# Patient Record
Sex: Male | Born: 1995 | Race: White | Hispanic: No | Marital: Single | Smoking: Never smoker
Health system: Southern US, Community
[De-identification: ages and names within clinical notes are randomized; demographics above are authoritative.]

## PROBLEM LIST (undated history)

## (undated) DIAGNOSIS — S069X9A Unspecified intracranial injury with loss of consciousness of unspecified duration, initial encounter: Secondary | ICD-10-CM

## (undated) DIAGNOSIS — S069XAA Unspecified intracranial injury with loss of consciousness status unknown, initial encounter: Secondary | ICD-10-CM

## (undated) DIAGNOSIS — F909 Attention-deficit hyperactivity disorder, unspecified type: Secondary | ICD-10-CM

## (undated) HISTORY — PX: ABDOMINAL SURGERY: SHX537

---
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
## 2017-01-19 IMAGING — DX DG ANKLE COMPLETE 3+V*R*
3 series · 3 of 3 positions shown · non-contrast
Comparison: None.

CLINICAL DATA: Ankle injury today

EXAM:
RIGHT ANKLE - COMPLETE 3+ VIEW

[ankle ap]
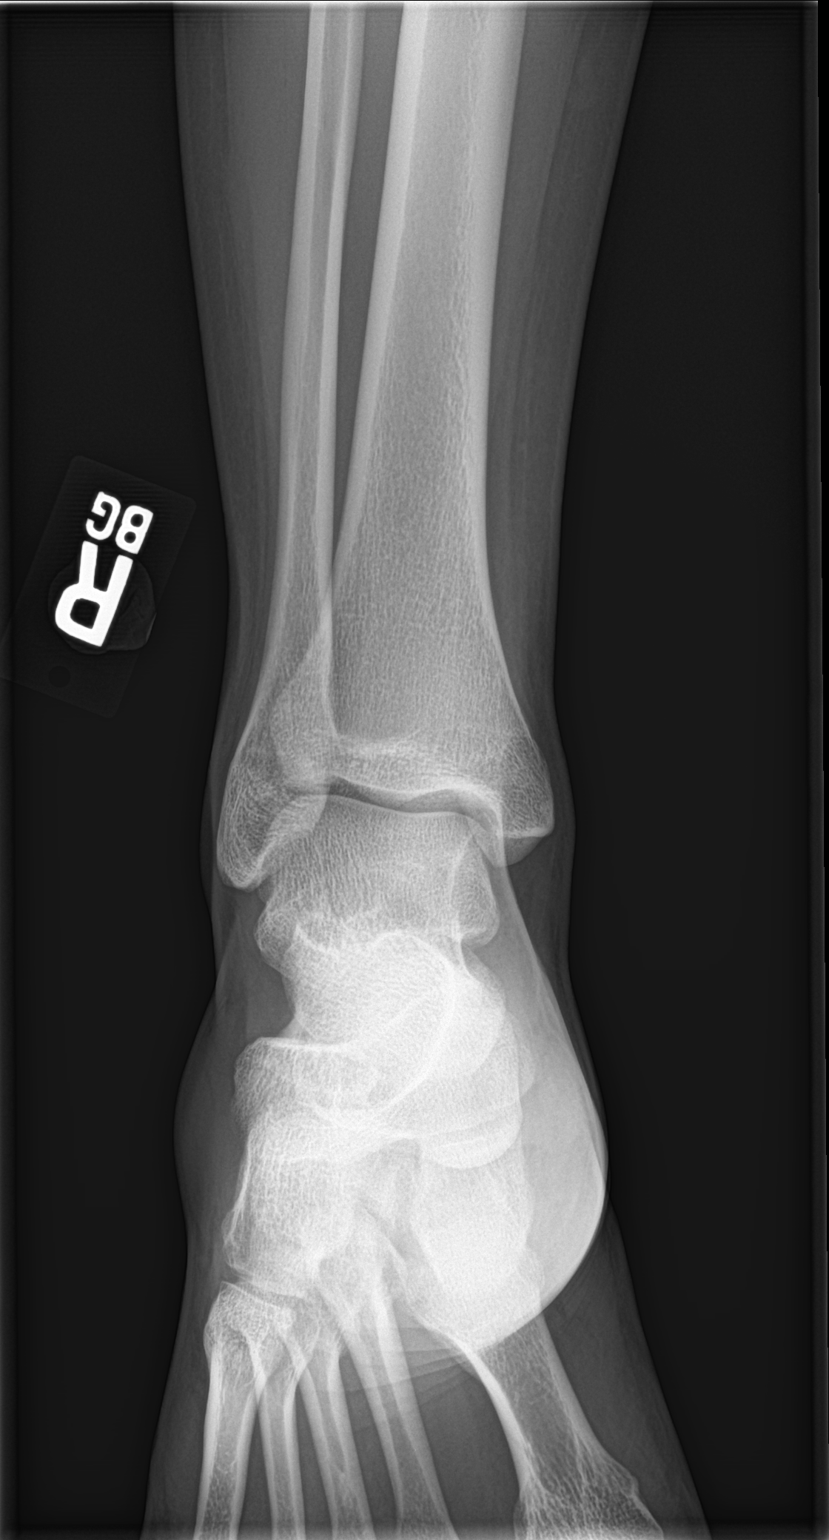

[ankle obl]
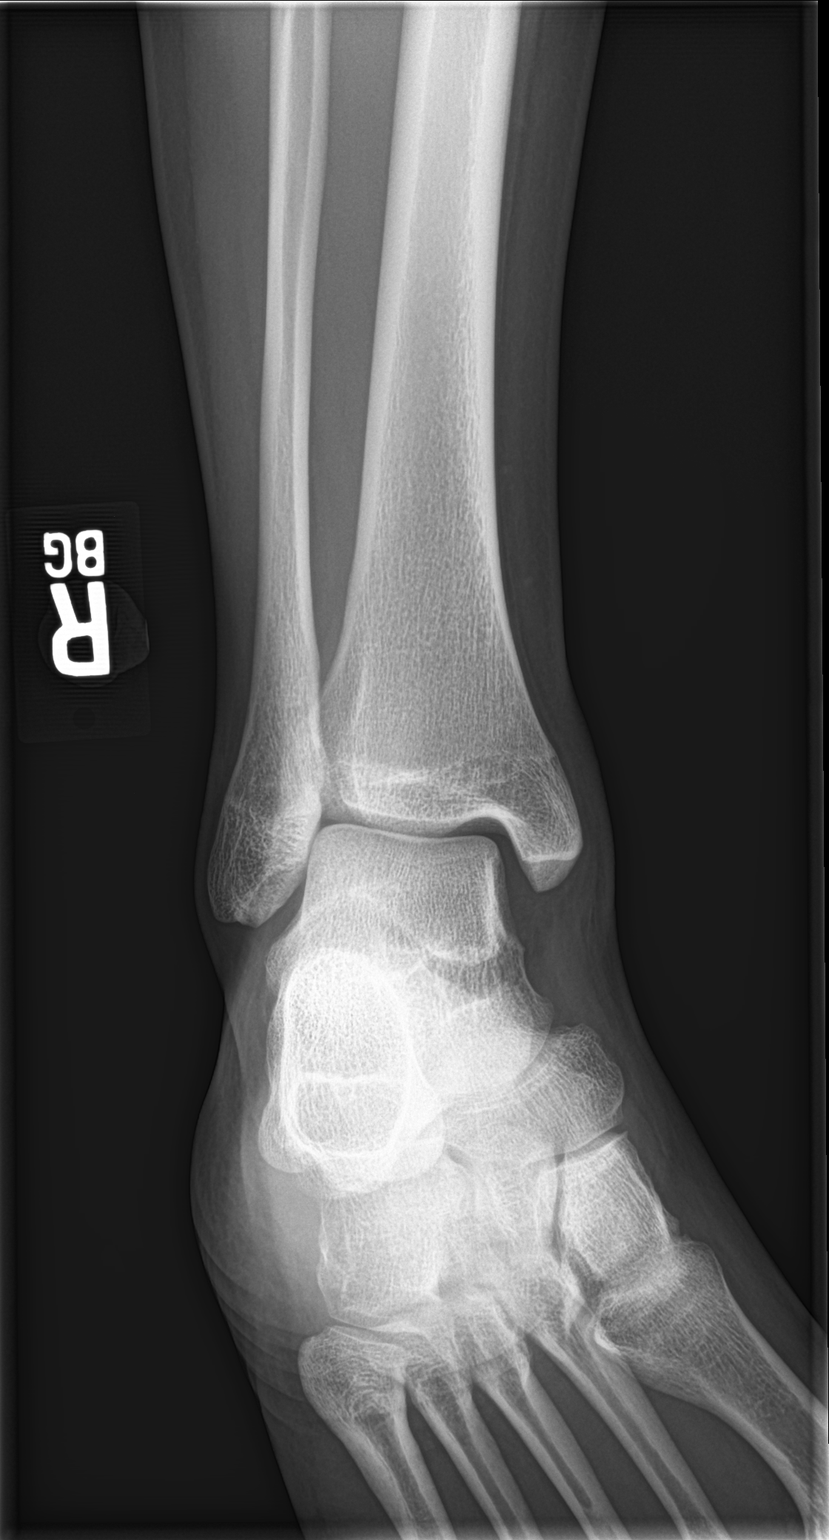

[ankle lat]
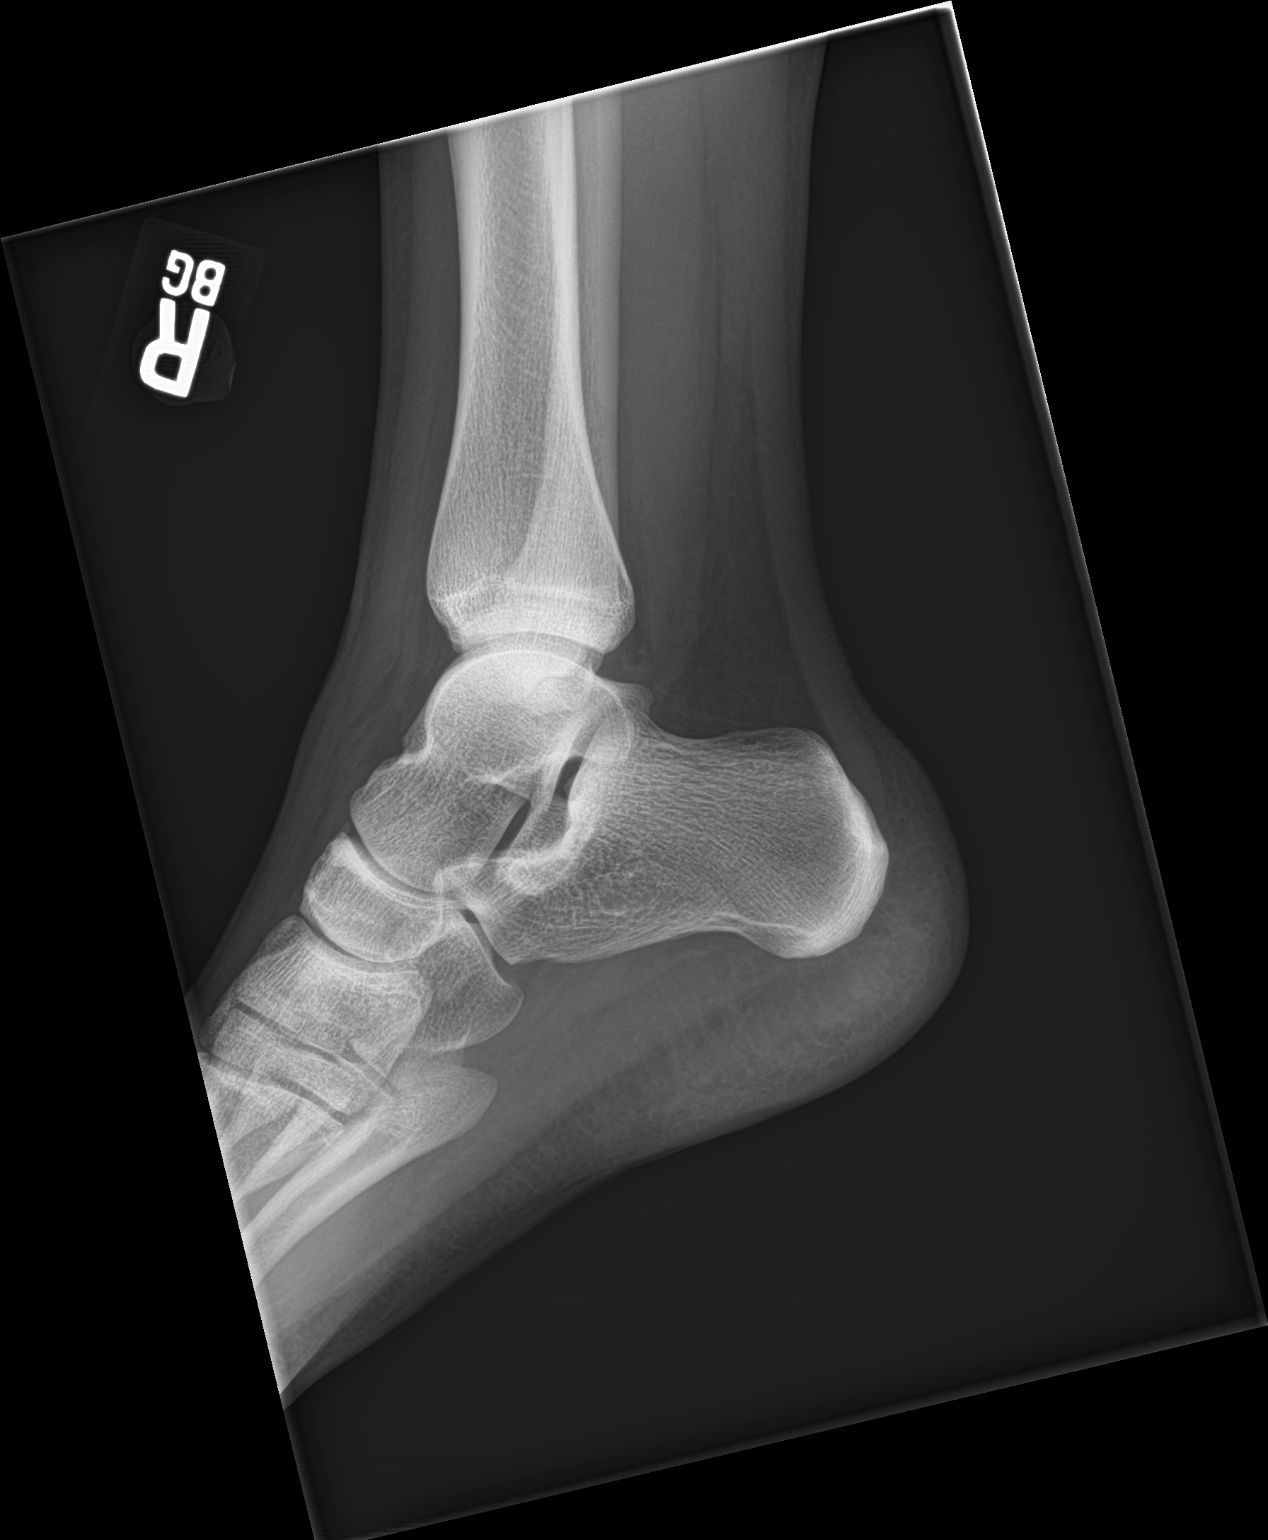

[3 of 3 positions shown; findings below may reference images not displayed]

FINDINGS: No acute fracture.  No dislocation.
IMPRESSION: No acute bony pathology.

## 2019-07-03 ENCOUNTER — Other Ambulatory Visit: Payer: Self-pay

## 2019-07-03 ENCOUNTER — Encounter (HOSPITAL_COMMUNITY): Payer: Self-pay | Admitting: Emergency Medicine

## 2019-07-03 ENCOUNTER — Emergency Department (HOSPITAL_COMMUNITY)
Admission: EM | Admit: 2019-07-03 | Discharge: 2019-07-04 | Disposition: A | Discharge status: Home / Self Care | Payer: BC Managed Care – PPO | Attending: Emergency Medicine | Admitting: Emergency Medicine

## 2019-07-03 DIAGNOSIS — Y999 Unspecified external cause status: Secondary | ICD-10-CM | POA: Diagnosis not present

## 2019-07-03 DIAGNOSIS — S91311A Laceration without foreign body, right foot, initial encounter: Secondary | ICD-10-CM | POA: Insufficient documentation

## 2019-07-03 DIAGNOSIS — Y92199 Unspecified place in other specified residential institution as the place of occurrence of the external cause: Secondary | ICD-10-CM | POA: Insufficient documentation

## 2019-07-03 DIAGNOSIS — W25XXXA Contact with sharp glass, initial encounter: Secondary | ICD-10-CM | POA: Insufficient documentation

## 2019-07-03 DIAGNOSIS — S99921A Unspecified injury of right foot, initial encounter: Secondary | ICD-10-CM | POA: Diagnosis present

## 2019-07-03 DIAGNOSIS — Y939 Activity, unspecified: Secondary | ICD-10-CM | POA: Diagnosis not present

## 2019-07-03 MED ORDER — LIDOCAINE-EPINEPHRINE (PF) 2 %-1:200000 IJ SOLN
10.0000 mL | Freq: Once | INTRAMUSCULAR | Status: AC
  Administered 2019-07-04: 10 mL
  Filled 2019-07-03: qty 10

## 2019-07-03 MED ORDER — PENTAFLUOROPROP-TETRAFLUOROETH EX AERO
INHALATION_SPRAY | CUTANEOUS | Status: DC | PRN
  Filled 2019-07-03: qty 116

## 2019-07-03 NOTE — ED Triage Notes (Signed)
Pt here after dropping a glass panel on his foot and now has laceration to RIGHT foot. Laceration dressed in triage with pressure bandage.

## 2019-07-04 ENCOUNTER — Emergency Department (HOSPITAL_COMMUNITY): Payer: BC Managed Care – PPO

## 2019-07-04 NOTE — ED Provider Notes (Signed)
[facility] EMERGENCY DEPARTMENT Provider Note   CSN: [id number] Arrival date & time: 07/03/19  2234     History Chief Complaint  Patient presents with  . Laceration    DAL BLEW is a 24 y.o. male.  Patient sustained laceration to his right lateral foot.  States he was changing a window pane of glass and dropped it when it shattered onto his bare feet.  Sustained laceration to the right lateral foot with extensive bleeding.  He says there is some numbness and tingling around the site of the wound.  He is able to wiggle his toes and move his ankle up and down.  No other injury.  Tetanus is up-to-date.  The history is provided by the patient.  Laceration Associated symptoms: no fever        Past Medical History:  Diagnosis Date  . ADHD (attention deficit hyperactivity disorder)   . TBI (traumatic brain injury) ([place])     There are no problems to display for this patient.   Past Surgical History:  Procedure Laterality Date  . ABDOMINAL SURGERY         History reviewed. No pertinent family history.  Social History   Tobacco Use  . Smoking status: Never Smoker  Substance Use Topics  . Alcohol use: No  . Drug use: No    Home Medications Prior to Admission medications   Medication Sig Start Date End Date Taking? Authorizing Provider  amphetamine-dextroamphetamine (ADDERALL XR) 30 MG 24 hr capsule Take 30 mg by mouth every morning.  06/04/14   [provider]  ibuprofen (ADVIL,MOTRIN) 200 MG tablet Take 800 mg by mouth every 6 (six) hours as needed for mild pain or moderate pain.    [provider]    Allergies    Chloral hydrate  Review of Systems   Review of Systems  Constitutional: Negative for activity change, appetite change and fever.  HENT: Negative for congestion and rhinorrhea.   Eyes: Negative for visual disturbance.  Respiratory: Negative for cough and shortness of breath.   Cardiovascular: Negative for chest pain.   Gastrointestinal: Negative for abdominal pain, nausea and vomiting.  Genitourinary: Negative for dysuria and hematuria.  Musculoskeletal: Negative for arthralgias and myalgias.  Skin: Positive for wound.  Neurological: Negative for dizziness, weakness and headaches.   all other systems are negative except as noted in the HPI and PMH.    Physical Exam Updated Vital Signs BP 138/73 (BP Location: Right Arm)   Pulse 100   Temp 98.3 F (36.8 C) (Oral)   Resp 18   Ht 6' (1.829 m)   Wt 99.8 kg   SpO2 100%   BMI 29.84 kg/m   Physical Exam Vitals and nursing note reviewed.  Constitutional:      General: He is not in acute distress.    Appearance: He is well-developed.  HENT:     Head: Normocephalic and atraumatic.     Mouth/Throat:     Pharynx: No oropharyngeal exudate.  Eyes:     Conjunctiva/sclera: Conjunctivae normal.     Pupils: Pupils are equal, round, and reactive to light.  Neck:     Comments: No meningismus. Cardiovascular:     Rate and Rhythm: Normal rate and regular rhythm.     Heart sounds: Normal heart sounds. No murmur heard.   Pulmonary:     Effort: Pulmonary effort is normal. No respiratory distress.     Breath sounds: Normal breath sounds.  Abdominal:  Palpations: Abdomen is soft.     Tenderness: There is no abdominal tenderness. There is no guarding or rebound.  Musculoskeletal:        General: Tenderness present. Normal range of motion.     Cervical back: Normal range of motion and neck supple.     Comments: There is a 5 cm laceration to the right lateral foot just anterior to lateral malleolus.  He reports some surrounding numbness and tingling. Laceration to the muscle.  There is brisk venous bleeding but no pulsatile bleeding.  Able to wiggle toes. Ankle flexion and extension intact. Achilles intact. No visible or palpable foreign bodies.  Skin:    General: Skin is warm.     Capillary Refill: Capillary refill takes less than 2 seconds.   Neurological:     General: No focal deficit present.     Mental Status: He is alert and oriented to person, place, and time. Mental status is at baseline.     Cranial Nerves: No cranial nerve deficit.     Motor: No abnormal muscle tone.     Coordination: Coordination normal.     Comments: No ataxia on finger to nose bilaterally. No pronator drift. 5/5 strength throughout. CN 2-12 intact.Equal grip strength. Sensation intact.   Psychiatric:        Behavior: Behavior normal.     ED Results / Procedures / Treatments   Labs (all labs ordered are listed, but only abnormal results are displayed) Labs Reviewed - No data to display  EKG None  Radiology DG Foot Complete Right  Result Date: 07/04/2019 CLINICAL DATA:  Laceration to right foot with glass EXAM: RIGHT FOOT COMPLETE - 3+ VIEW COMPARISON:  None. FINDINGS: There is no evidence of fracture or dislocation. There is no evidence of arthropathy or other focal bone abnormality. Lateral foot soft tissue swelling. IMPRESSION: Lateral foot soft tissue swelling. No radiopaque foreign body or osseous abnormality. Electronically Signed   By: Deatra Robinson M.D.   On: 07/04/2019 00:31    Procedures .Marland KitchenLaceration Repair  Date/Time: 07/04/2019 1:30 AM Performed by: Glynn Octave, MD Authorized by: Glynn Octave, MD   Consent:    Consent obtained:  Verbal   Consent given by:  Patient   Risks discussed:  Infection, need for additional repair, poor wound healing, poor cosmetic result, pain, retained foreign body, tendon damage, vascular damage and nerve damage   Alternatives discussed:  No treatment Anesthesia (see MAR for exact dosages):    Anesthesia method:  Local infiltration   Local anesthetic:  Lidocaine 2% WITH epi Laceration details:    Location:  Foot   Foot location:  Top of R foot   Length (cm):  5 Repair type:    Repair type:  Intermediate Pre-procedure details:    Preparation:  Patient was prepped and draped in usual  sterile fashion and imaging obtained to evaluate for foreign bodies Exploration:    Hemostasis achieved with:  Epinephrine and direct pressure   Wound exploration: wound explored through full range of motion and entire depth of wound probed and visualized     Wound extent: muscle damage, nerve damage and vascular damage     Wound extent: no foreign bodies/material noted and no underlying fracture noted     Contaminated: no   Treatment:    Area cleansed with:  Betadine   Amount of cleaning:  Extensive   Irrigation solution:  Sterile saline   Irrigation method:  Syringe   Visualized foreign bodies/material removed: no  Skin repair:    Repair method:  Sutures   Suture size:  4-0   Suture material:  Nylon   Suture technique:  Simple interrupted   Number of sutures:  7 Approximation:    Approximation:  Close Post-procedure details:    Dressing:  Antibiotic ointment, adhesive bandage and splint for protection   Patient tolerance of procedure:  Tolerated well, no immediate complications   (including critical care time)  Medications Ordered in ED Medications  pentafluoroprop-tetrafluoroeth (GEBAUERS) aerosol (has no administration in time range)  lidocaine-EPINEPHrine (XYLOCAINE W/EPI) 2 %-1:200000 (PF) injection 10 mL (has no administration in time range)    ED Course  I have reviewed the triage vital signs and the nursing notes.  Pertinent labs & imaging results that were available during my care of the patient were reviewed by me and considered in my medical decision making (see chart for details).    MDM Rules/Calculators/A&P                         Right foot laceration from glass.  Some paresthesias.  Possibility of nerve injury discussed with patient.  No major vessel injury or tendon injury evident. Able to wiggle toes and flex and extend ankle.   Laceration cleaned as above.  Tetanus is up-to-date.  X-ray negative for foreign bodies.  Wound repaired as above.  Discussed  with patient he may have some nerve damage or possible tendon damage given the paresthesias in the area.  There is no evidence of arterial injury..  He does have significant oozing and injury to the muscle but no pulsatile bleeding.  Follow-up with orthopedics for recheck of his nerve and tendon function.  Sutures to remove in 10 days.  Patient given crutches and cam walker.  Keep foot elevated, apply ice, minimize weightbearing. Return precautions discussed.  Final Clinical Impression(s) / ED Diagnoses Final diagnoses:  Laceration of right foot, initial encounter    Rx / DC Orders ED Discharge Orders    None       Danelly Hassinger, Jeannett Senior, MD 07/04/19 0214

## 2019-07-04 NOTE — ED Notes (Signed)
Pt ambulatory to waiting room. Pt verbalized understanding of discharge instructions.   

## 2019-07-04 NOTE — Discharge Instructions (Signed)
Keep the wound clean and dry for 24 hours.  Elevate the foot and apply ice.  As we discussed, you may have some nerve damage in the area of numbness and tingling.  You should follow-up with the orthopedic doctor to evaluate this.  Sutures should be removed in 7 to 10 days.  Return to the ED with new or worsening symptoms.

## 2022-02-04 IMAGING — DX DG FOOT COMPLETE 3+V*R*
3 series · 3 of 3 positions shown · non-contrast
Comparison: None.

CLINICAL DATA: Laceration to right foot with glass

EXAM:
RIGHT FOOT COMPLETE - 3+ VIEW

[foot ap]
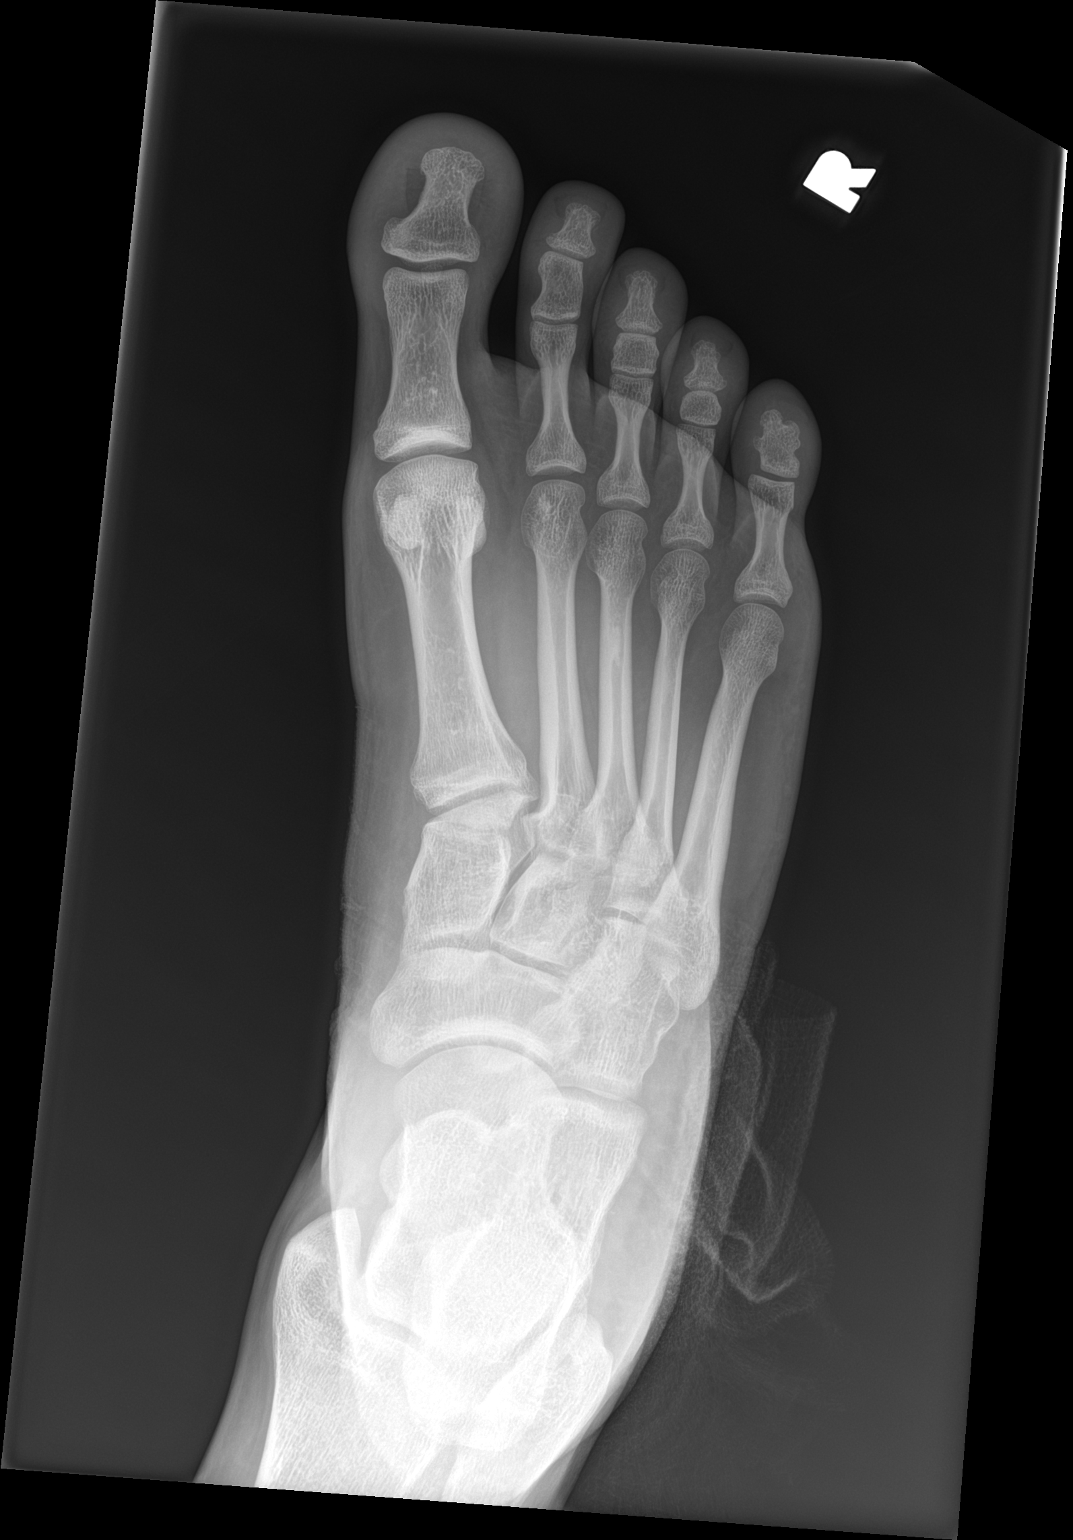

[foot obl]
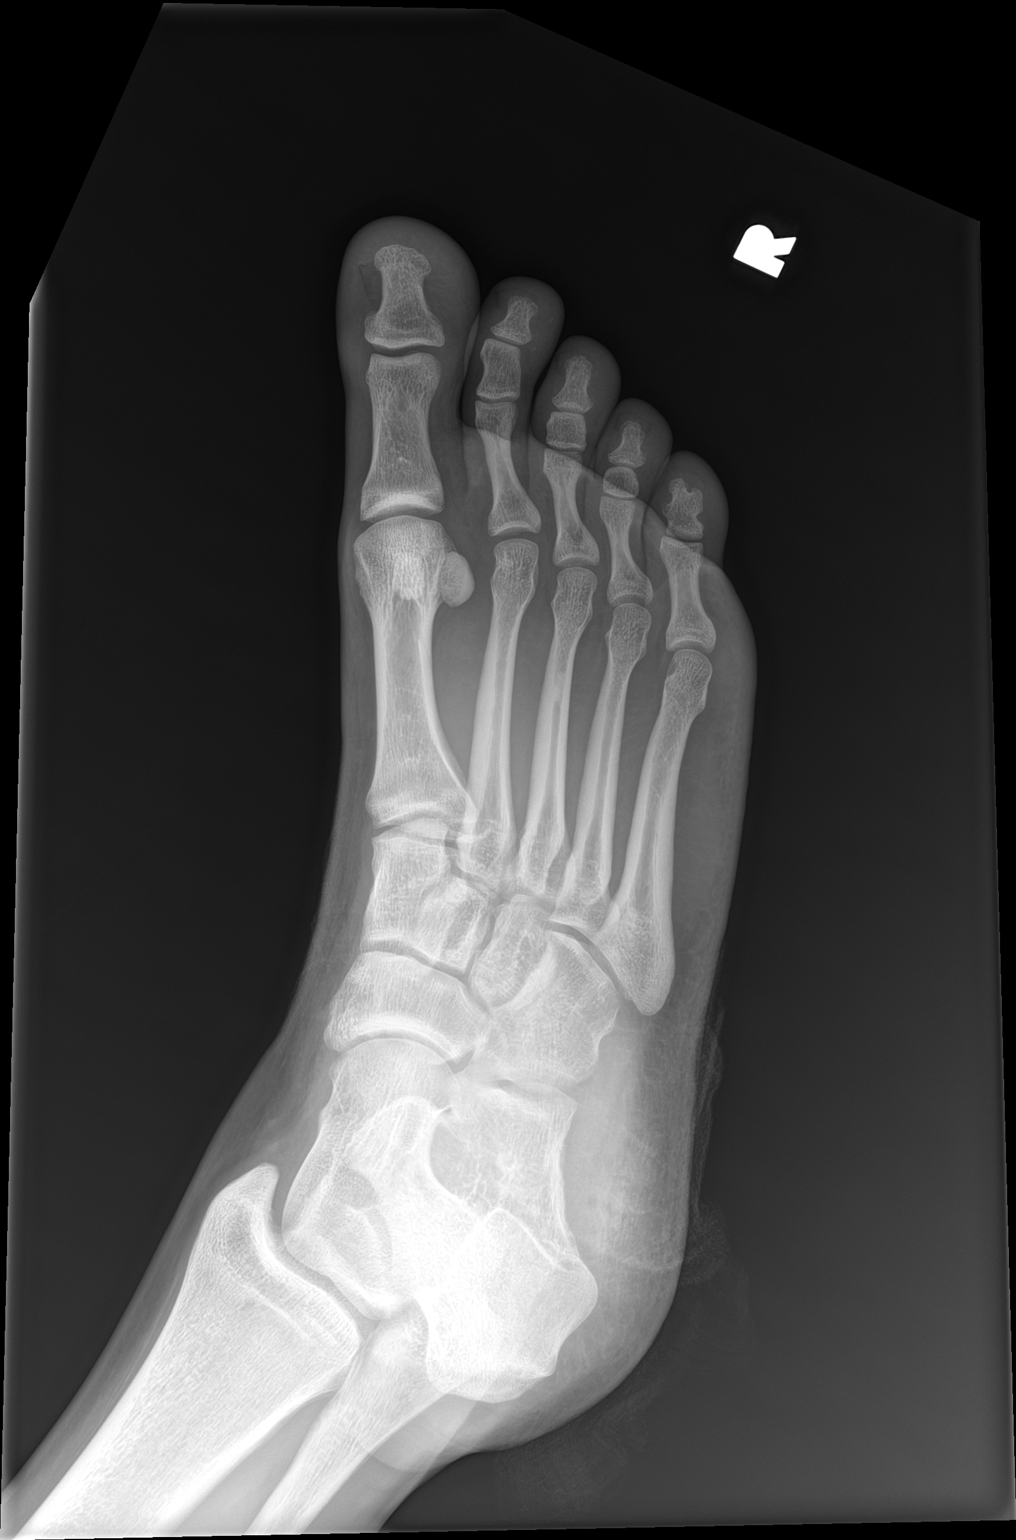

[foot lat]
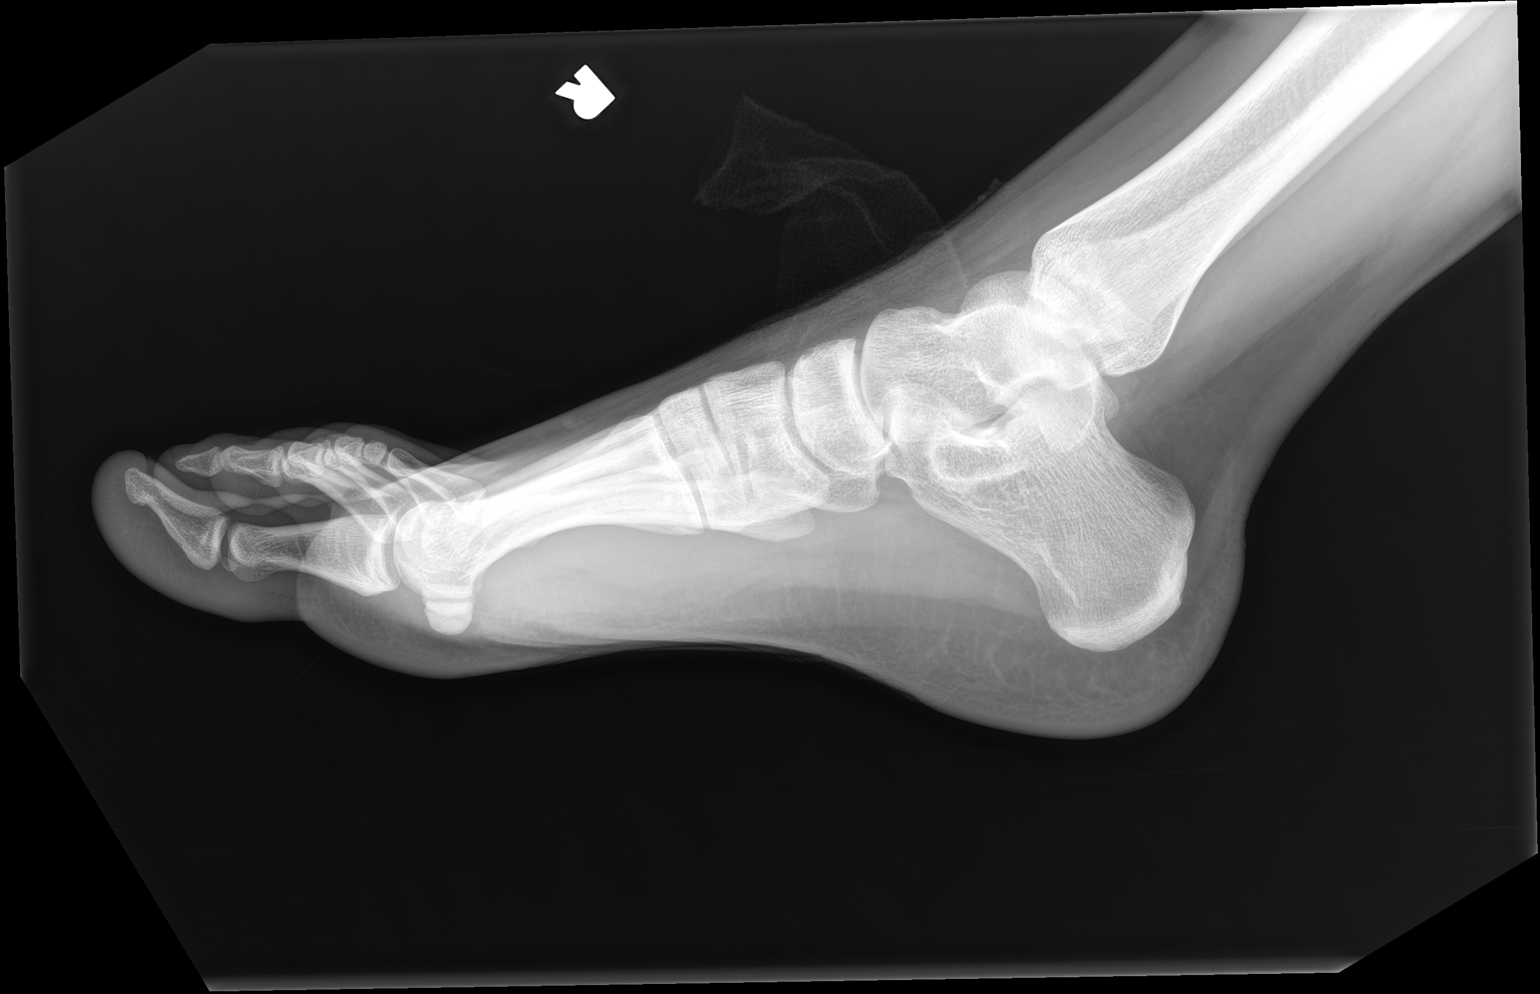

[3 of 3 positions shown; findings below may reference images not displayed]

FINDINGS: There is no evidence of fracture or dislocation. There is no
evidence of arthropathy or other focal bone abnormality. Lateral
foot soft tissue swelling.
IMPRESSION: Lateral foot soft tissue swelling. No radiopaque foreign body or
osseous abnormality.
